# Patient Record
Sex: Female | Born: 2000 | Race: White | Hispanic: No | Smoking: Never smoker
Health system: Southern US, Community
[De-identification: ages and names within clinical notes are randomized; demographics above are authoritative.]

## PROBLEM LIST (undated history)

## (undated) DIAGNOSIS — F419 Anxiety disorder, unspecified: Secondary | ICD-10-CM

---
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
## 2023-08-17 ENCOUNTER — Ambulatory Visit: Admitting: Obstetrics and Gynecology

## 2023-08-17 ENCOUNTER — Encounter: Payer: Self-pay | Admitting: Obstetrics and Gynecology

## 2023-08-17 VITALS — BP 135/89 | HR 86 | Ht 70.0 in | Wt 218.1 lb

## 2023-08-17 DIAGNOSIS — Z3046 Encounter for surveillance of implantable subdermal contraceptive: Secondary | ICD-10-CM | POA: Diagnosis not present

## 2023-08-17 LAB — POCT URINE PREGNANCY: Preg Test, Ur: NEGATIVE

## 2023-08-17 MED ORDER — JUNEL FE 24 1-20 MG-MCG(24) PO TABS
1.0000 | ORAL_TABLET | Freq: Every day | ORAL | 4 refills | Status: DC
Start: 2023-08-17 — End: 2023-11-09

## 2023-08-18 NOTE — Progress Notes (Signed)
   GYNECOLOGY OFFICE PROCEDURE NOTE  Michelle Stanton is a 23 y.o. G0P0000 here for Nexplanon removal. Notes weight gain since placement and her last GYN couldn't palpate the implant. Mom is [employer] and she was able to get x ray there - has photos on phone that confirms presence of implant in the left arm.  Nexplanon Removal Patient identified, informed consent performed, consent signed.   Appropriate time out taken.   Nexplanon site identified. The distal end of the implant was able to be palpated. Area prepped in usual sterile fashon. 0.5 ml of 1% lidocaine was used to anesthetize the area at the distal end of the implant. A small stab incision was made right beside the implant on the distal portion. Sharp dissection was used to break up the overlying capsule. After several attempts, the Nexplanon rod was able to be grasped with hemostats and removed intact. It was a difficult procedure due to depth of the implant, but it was still within subcutaneous tissue. Blood loss was minimal. There were no complications.  Steri-strips were applied over the small incision.  A pressure bandage was applied to reduce any bruising.    The patient tolerated the procedure well and was given post procedure instructions.  Patient is planning to use combined oral contraceptives for contraception.  Kieth Carolin, MD Obstetrician & Gynecologist, [facility] for [employer], [facility]

## 2023-08-31 ENCOUNTER — Telehealth: Payer: Self-pay

## 2023-08-31 NOTE — Telephone Encounter (Signed)
 Patient left message with triage to schedule an appointment. Called patient back and was unable to leave a voicemail.

## 2023-10-15 ENCOUNTER — Telehealth: Payer: Self-pay | Admitting: *Deleted

## 2023-10-15 NOTE — Telephone Encounter (Signed)
 Voicemail not set up.

## 2023-11-09 ENCOUNTER — Encounter: Payer: Self-pay | Admitting: Obstetrics and Gynecology

## 2023-11-09 ENCOUNTER — Other Ambulatory Visit (HOSPITAL_COMMUNITY)
Admission: RE | Admit: 2023-11-09 | Discharge: 2023-11-09 | Disposition: A | Discharge status: Home / Self Care | Source: Ambulatory Visit | Attending: Obstetrics and Gynecology | Admitting: Obstetrics and Gynecology

## 2023-11-09 ENCOUNTER — Ambulatory Visit (INDEPENDENT_AMBULATORY_CARE_PROVIDER_SITE_OTHER): Admitting: Obstetrics and Gynecology

## 2023-11-09 VITALS — BP 134/86 | HR 111 | Ht 70.0 in | Wt 222.0 lb

## 2023-11-09 DIAGNOSIS — Z809 Family history of malignant neoplasm, unspecified: Secondary | ICD-10-CM

## 2023-11-09 DIAGNOSIS — Z01419 Encounter for gynecological examination (general) (routine) without abnormal findings: Secondary | ICD-10-CM | POA: Diagnosis present

## 2023-11-09 DIAGNOSIS — Z113 Encounter for screening for infections with a predominantly sexual mode of transmission: Secondary | ICD-10-CM | POA: Diagnosis not present

## 2023-11-09 DIAGNOSIS — Z1331 Encounter for screening for depression: Secondary | ICD-10-CM

## 2023-11-09 NOTE — Patient Instructions (Addendum)
 It was good catching up with you today! You will see your results in the MyChart app within 1 week.   I was able to pull in your vaccine records and you are up to date on everything!    Avoid: - Synthetic underwear - Tight pants - Swim suits, thongs, leotards, leggings for prolonged periods of time - Scented soap/shampoo - Bubble baths - Scented detergents, dryer sheets - Baby wipes - Feminine sprays, douches, powders - Panty liners - Dyed toilet paper - Shaving  Trying swapping out the above for: - Cotton or no underwear - Loose pants, skirts, dresses - Changing out of swimwear, thongs, and workout gear as soon as you're done exercising - Fragrance free soaps (like Dove sensitive skin) - Warm plain water baths - Unscented laundry detergent - Use a bedet or peri bottle to rinse instead of baby wipes - Tampons, cotton pads, cotton period underwear - Undyed toilet paper - Clipping hair

## 2023-11-09 NOTE — Progress Notes (Signed)
 ANNUAL EXAM Patient name: Michelle Stanton MRN [id number]  Date of birth: Jan 28, 2000 Chief Complaint:   Gynecologic Exam (Pt reported would like STD testing, occasionally has vaginal odor, no abnormal discharge)  History of Present Illness:   Michelle Stanton is a 23 y.o. G0P0000 with Patient's last menstrual period was 08/07/2023 (approximate). being seen today for a routine annual exam.  Current complaints:   Vaginal odor - occurs randomly. Uses a dove senstive skin soap or a soap called megababe pH balanced soap on the vulva only, not in the vagina. Uses scented laundry detergent. Had an episode of fishy odor but that passed on it's own   The pregnancy intention screening data noted above was reviewed. Potential methods of contraception were discussed. The patient elected to proceed with Oral Contraceptive.   Last pap never Last mammogram: n/a no FHX Last colonoscopy: n/a no FHX HPV vaccine: completed     11/09/2023   10:54 AM  Depression screen PHQ 2/9  Decreased Interest 1  Down, Depressed, Hopeless 0  PHQ - 2 Score 1  Altered sleeping 1  Tired, decreased energy 1  Change in appetite 0  Feeling bad or failure about yourself  0  Trouble concentrating 0  Moving slowly or fidgety/restless 0  Suicidal thoughts 0  PHQ-9 Score 3        11/09/2023   10:54 AM  GAD 7 : Generalized Anxiety Score  Nervous, Anxious, on Edge 0  Control/stop worrying 0  Worry too much - different things 1  Trouble relaxing 0  Restless 0  Easily annoyed or irritable 0  Afraid - awful might happen 0  Total GAD 7 Score 1   Review of Systems:   Pertinent items are noted in HPI Denies any headaches, blurred vision, fatigue, shortness of breath, chest pain, abdominal pain, abnormal vaginal discharge/itching/odor/irritation, problems with periods, bowel movements, urination, or intercourse unless otherwise stated above. Pertinent History Reviewed:  Reviewed past medical,surgical,  social and family history.  Reviewed problem Stanton, medications and allergies. Physical Assessment:   Vitals:   11/09/23 1102 11/09/23 1104  BP: (!) 138/93 134/86  Pulse: (!) 112 (!) 111  Weight: 222 lb (100.7 kg)   Height: 5' 10 (1.778 m)   Body mass index is 31.85 kg/m.        Physical Examination:   General appearance - well appearing, and in no distress  Mental status - alert, oriented to person, place, and time  Chest - respiratory effort normal  Heart - normal peripheral perfusion  Breasts - offered, deferred. Counseled on breast self awareness  Pelvic - VULVA: normal appearing vulva with no masses, tenderness or lesions  VAGINA: normal appearing vagina with normal color and discharge, no lesions  CERVIX: normal appearing cervix without discharge or lesions  Thin prep pap is done with reflex HR HPV cotesting  Chaperone present for exam  No results found for this or any previous visit (from the past 24 hours).  Assessment & Plan:  1) Well-Woman Exam Mammogram: @ 21yo, or sooner if problems Colonoscopy: @ 21yo, or sooner if problems Pap: Collected Gardasil: Completed GC/CT: Collected HIV/HCV: Ordered COCP for contraceptive; initial BP elevated but normal on recheck OK to continue Discussed genetics referral for Fhx of ovarian & pancreatic cancer on maternal side of family Declines flu shot  2) Vaginal odor Normal exam today Discussed vulvar care and potential swaps that can help with her symptoms Recommend RN visit with next set of symptoms to assess for BV/VVC  Labs/procedures today:   Orders Placed This Encounter  Procedures   RPR+HBsAg+HCVAb+...   Ambulatory referral to Genetics   Meds: No orders of the defined types were placed in this encounter.  Follow-up: Return in about 1 year (around 11/08/2024) for annual exam or sooner as needed.  Kieth JAYSON Carolin, MD 11/09/2023 12:34 PM

## 2023-11-11 ENCOUNTER — Ambulatory Visit: Payer: Self-pay | Admitting: Obstetrics and Gynecology

## 2023-11-11 LAB — CYTOLOGY - PAP
Chlamydia: NEGATIVE
Comment: NEGATIVE
Comment: NEGATIVE
Comment: NORMAL
Diagnosis: NEGATIVE
Diagnosis: REACTIVE
Neisseria Gonorrhea: NEGATIVE
Trichomonas: NEGATIVE

## 2023-11-11 LAB — RPR+HBSAG+HCVAB+...
HIV Screen 4th Generation wRfx: NONREACTIVE
Hep C Virus Ab: NONREACTIVE
Hepatitis B Surface Ag: NEGATIVE
RPR Ser Ql: NONREACTIVE

## 2024-01-20 ENCOUNTER — Ambulatory Visit: Admitting: Obstetrics and Gynecology

## 2024-01-20 ENCOUNTER — Encounter: Payer: Self-pay | Admitting: Obstetrics and Gynecology

## 2024-01-20 VITALS — BP 138/87 | HR 106 | Ht 70.0 in | Wt 215.1 lb

## 2024-01-20 DIAGNOSIS — Z3009 Encounter for other general counseling and advice on contraception: Secondary | ICD-10-CM | POA: Diagnosis not present

## 2024-01-20 NOTE — Progress Notes (Signed)
" ° °  RETURN GYNECOLOGY VISIT  Subjective:  Michelle Stanton is a 24 y.o. G0P0000 on COCs presenting to discuss stopping contraception  Would like to stop contraceptive pills. Has been on some form of contraception shortly after menarche because of issues with her periods. Went from Nexplanon to COCs. Does not get regular withdrawal bleed on COCs. She'd like to see what her body does off of birth control. She does not plan to use contraception and she is OK either way if she were to become pregnant.   Objective:   Vitals:   01/20/24 1455  BP: 138/87  Pulse: (!) 106  Weight: 215 lb 1.9 oz (97.6 kg)  Height: 5' 10 (1.778 m)   General:  Alert, oriented and cooperative. Patient is in no acute distress.  Skin: Skin is warm and dry. No rash noted.   Cardiovascular: Normal heart rate noted  Respiratory: Normal respiratory effort, no problems with respiration noted   Assessment and Plan:  Michelle Stanton is a 24 y.o. here to discuss birth control discontinuation  Encounter for general counseling and advice on contraceptive management - She is OK with getting pregnant. Recommend starting PNV - Reviewed periods may get heavier or more painful, may notice changes with skin/hair - Track cycles, can expect menstrual irregularity as body adjusts  Total encounter time: 20 minutes  Return in about 1 year (around 01/19/2025) for annual exam or sooner as needed.  Kieth JAYSON Carolin, MD  "
